# Patient Record
Sex: Male | Born: 1990 | Hispanic: No | Marital: Single | Smoking: Current every day smoker
Health system: Southern US, Community
[De-identification: ages and names within clinical notes are randomized; demographics above are authoritative.]

---

## 2013-03-11 ENCOUNTER — Encounter (HOSPITAL_COMMUNITY): Payer: Self-pay | Admitting: Emergency Medicine

## 2013-03-11 ENCOUNTER — Other Ambulatory Visit: Payer: Self-pay

## 2013-03-11 ENCOUNTER — Emergency Department (HOSPITAL_COMMUNITY): Payer: PRIVATE HEALTH INSURANCE

## 2013-03-11 ENCOUNTER — Emergency Department (HOSPITAL_COMMUNITY)
Admission: EM | Admit: 2013-03-11 | Discharge: 2013-03-11 | Disposition: A | Discharge status: Home / Self Care | Payer: PRIVATE HEALTH INSURANCE | Attending: Emergency Medicine | Admitting: Emergency Medicine

## 2013-03-11 DIAGNOSIS — R071 Chest pain on breathing: Secondary | ICD-10-CM | POA: Insufficient documentation

## 2013-03-11 DIAGNOSIS — Z791 Long term (current) use of non-steroidal anti-inflammatories (NSAID): Secondary | ICD-10-CM | POA: Insufficient documentation

## 2013-03-11 DIAGNOSIS — R319 Hematuria, unspecified: Secondary | ICD-10-CM | POA: Insufficient documentation

## 2013-03-11 DIAGNOSIS — R0789 Other chest pain: Secondary | ICD-10-CM

## 2013-03-11 DIAGNOSIS — F17200 Nicotine dependence, unspecified, uncomplicated: Secondary | ICD-10-CM | POA: Insufficient documentation

## 2013-03-11 LAB — CBC WITH DIFFERENTIAL/PLATELET
BASOS ABS: 0.1 10*3/uL (ref 0.0–0.1)
Basophils Relative: 1 % (ref 0–1)
EOS PCT: 3 % (ref 0–5)
Eosinophils Absolute: 0.2 10*3/uL (ref 0.0–0.7)
HCT: 43.3 % (ref 39.0–52.0)
Hemoglobin: 15.7 g/dL (ref 13.0–17.0)
LYMPHS ABS: 2.9 10*3/uL (ref 0.7–4.0)
LYMPHS PCT: 38 % (ref 12–46)
MCH: 30.4 pg (ref 26.0–34.0)
MCHC: 36.3 g/dL — ABNORMAL HIGH (ref 30.0–36.0)
MCV: 83.9 fL (ref 78.0–100.0)
Monocytes Absolute: 0.7 10*3/uL (ref 0.1–1.0)
Monocytes Relative: 9 % (ref 3–12)
NEUTROS ABS: 3.9 10*3/uL (ref 1.7–7.7)
NEUTROS PCT: 50 % (ref 43–77)
Platelets: 242 10*3/uL (ref 150–400)
RBC: 5.16 MIL/uL (ref 4.22–5.81)
RDW: 12.1 % (ref 11.5–15.5)
WBC: 7.8 10*3/uL (ref 4.0–10.5)

## 2013-03-11 LAB — COMPREHENSIVE METABOLIC PANEL
ALK PHOS: 65 U/L (ref 39–117)
ALT: 29 U/L (ref 0–53)
AST: 19 U/L (ref 0–37)
Albumin: 4 g/dL (ref 3.5–5.2)
BUN: 19 mg/dL (ref 6–23)
CO2: 27 meq/L (ref 19–32)
Calcium: 9.7 mg/dL (ref 8.4–10.5)
Chloride: 100 mEq/L (ref 96–112)
Creatinine, Ser: 1.01 mg/dL (ref 0.50–1.35)
GFR calc Af Amer: >90 mL/min (ref >90)
GLUCOSE: 69 mg/dL — AB (ref 70–99)
POTASSIUM: 3.8 meq/L (ref 3.7–5.3)
SODIUM: 140 meq/L (ref 137–147)
Total Bilirubin: 0.6 mg/dL (ref 0.3–1.2)
Total Protein: 7.4 g/dL (ref 6.0–8.3)

## 2013-03-11 LAB — TROPONIN I
Troponin I: <0.3 ng/mL (ref <0.30)
Troponin I: <0.3 ng/mL (ref <0.30)

## 2013-03-11 LAB — RAPID URINE DRUG SCREEN, HOSP PERFORMED
Amphetamines: NOT DETECTED
BARBITURATES: NOT DETECTED
Benzodiazepines: NOT DETECTED
COCAINE: NOT DETECTED
Opiates: NOT DETECTED
TETRAHYDROCANNABINOL: NOT DETECTED

## 2013-03-11 LAB — D-DIMER, QUANTITATIVE: D-Dimer, Quant: 0.39 ug/mL-FEU (ref 0.00–0.48)

## 2013-03-11 MED ORDER — IBUPROFEN 800 MG PO TABS
800.0000 mg | ORAL_TABLET | Freq: Once | ORAL | Status: AC
  Administered 2013-03-11: 800 mg via ORAL
  Filled 2013-03-11: qty 1

## 2013-03-11 MED ORDER — IBUPROFEN 800 MG PO TABS: 800.0000 mg | ORAL_TABLET | Freq: Three times a day (TID) | ORAL | Status: AC

## 2013-03-11 NOTE — ED Notes (Signed)
Pt from home c/o L sided CP x3 days. Pt denies SOB, N/V/D, diaphoresis. Pt states that he goes to gym, but has not done anything differently that may have caused injury. Pt is A&O and in NAD. Pt family at bedside. Pt EKG completed

## 2013-03-11 NOTE — Discharge Instructions (Signed)
Chest Wall Pain °There is no evidence of heart attack or blood clot in the lung. Follow up with your doctor. Return to the ED if you develop new or worsening symptoms. °Chest wall pain is pain in or around the bones and muscles of your chest. It may take up to 6 weeks to get better. It may take longer if you must stay physically active in your work and activities.  °CAUSES  °Chest wall pain may happen on its own. However, it may be caused by: °· A viral illness like the flu. °· Injury. °· Coughing. °· Exercise. °· Arthritis. °· Fibromyalgia. °· Shingles. °HOME CARE INSTRUCTIONS  °· Avoid overtiring physical activity. Try not to strain or perform activities that cause pain. This includes any activities using your chest or your abdominal and side muscles, especially if heavy weights are used. °· Put ice on the sore area. °· Put ice in a plastic bag. °· Place a towel between your skin and the bag. °· Leave the ice on for 15-20 minutes per hour while awake for the first 2 days. °· Only take over-the-counter or prescription medicines for pain, discomfort, or fever as directed by your caregiver. °SEEK IMMEDIATE MEDICAL CARE IF:  °· Your pain increases, or you are very uncomfortable. °· You have a fever. °· Your chest pain becomes worse. °· You have new, unexplained symptoms. °· You have nausea or vomiting. °· You feel sweaty or lightheaded. °· You have a cough with phlegm (sputum), or you cough up blood. °MAKE SURE YOU:  °· Understand these instructions. °· Will watch your condition. °· Will get help right away if you are not doing well or get worse. °Document Released: 12/30/2004 Document Revised: 03/24/2011 Document Reviewed: 08/26/2010 °[employer]® Patient Information ©2014 [employer]. ° °

## 2013-03-11 NOTE — ED Provider Notes (Signed)
CSN: [id number]     Arrival date & time 03/11/13  4098 History   First MD Initiated Contact with Patient 03/11/13 [phone]     Chief Complaint  Patient presents with  . Chest Pain     (Consider location/radiation/quality/duration/timing/severity/associated sxs/prior Treatment) HPI Comments: Patient presents with three-day history of left-sided chest pain that is constant. He denies any type of injury but he does work out in [employer] Nothing makes the pain better or worse. His been constant nonradiating for 3 days. There is no shortness of breath, nausea, vomiting or diaphoresis. There is no neck, jaw or back pain. Patient is from [place] and had a plane trip one month ago. He denies any focal weakness, numbness or tingling. Denies any cough or fever. He denies any leg pain or leg swelling. The pain is worse with palpation and movement of his chest.  The history is provided by the patient and a friend.    History reviewed. No pertinent past medical history. History reviewed. No pertinent past surgical history. No family history on file. History  Substance Use Topics  . Smoking status: Current Every Day Smoker    Types: Pipe  . Smokeless tobacco: Not on file  . Alcohol Use: No    Review of Systems  Constitutional: Negative for fever, activity change and appetite change.  HENT: Negative for congestion and rhinorrhea.   Respiratory: Negative for cough, chest tightness and shortness of breath.   Cardiovascular: Positive for chest pain.  Gastrointestinal: Negative for nausea, vomiting and abdominal pain.  Genitourinary: Positive for hematuria. Negative for dysuria.  Musculoskeletal: Negative for arthralgias and back pain.  Skin: Negative for rash.  Neurological: Negative for dizziness, weakness and headaches.  A complete 10 system review of systems was obtained and all systems are negative except as noted in the HPI and PMH.      Allergies  Review of patient's allergies indicates no  known allergies.  Home Medications   Current Outpatient Rx  Name  Route  Sig  Dispense  Refill  . ibuprofen (ADVIL,MOTRIN) 800 MG tablet   Oral   Take 1 tablet (800 mg total) by mouth 3 (three) times daily.   21 tablet   0    BP 134/70  Pulse 84  Temp(Src) 97.8 F (36.6 C) (Oral)  Resp 19  SpO2 99% Physical Exam  Constitutional: He is oriented to person, place, and time. He appears well-developed and well-nourished.  HENT:  Head: Normocephalic and atraumatic.  Mouth/Throat: Oropharynx is clear and moist. No oropharyngeal exudate.  Eyes: Conjunctivae and EOM are normal. Pupils are equal, round, and reactive to light.  Neck: Normal range of motion. Neck supple.  Cardiovascular: Normal rate, regular rhythm and normal heart sounds.   No murmur heard. Pulmonary/Chest: Effort normal and breath sounds normal. No respiratory distress. He exhibits tenderness.  TTP L chest wall with point tenderness, no crepitance  Abdominal: Soft. There is no tenderness. There is no rebound and no guarding.  Musculoskeletal: Normal range of motion. He exhibits no edema and no tenderness.  Neurological: He is alert and oriented to person, place, and time. No cranial nerve deficit. He exhibits normal muscle tone. Coordination normal.  Skin: Skin is warm.    ED Course  Procedures (including critical care time) Labs Review Labs Reviewed  CBC WITH DIFFERENTIAL - Abnormal; Notable for the following:    MCHC 36.3 (*)    All other components within normal limits  COMPREHENSIVE METABOLIC PANEL - Abnormal; Notable for  the following:    Glucose, Bld 69 (*)    All other components within normal limits  TROPONIN I  D-DIMER, QUANTITATIVE  URINE RAPID DRUG SCREEN (HOSP PERFORMED)  TROPONIN I   Imaging Review Dg Chest 2 View  03/11/2013   CLINICAL DATA:  Chest pain.  EXAM: CHEST  2 VIEW  COMPARISON:  None.  FINDINGS: The heart size and pulmonary vascularity are normal and the lungs are clear. There is a  slight lumbar scoliosis. No other osseous abnormality.  IMPRESSION: Normal chest.   Electronically Signed   By: Geanie CooleyJim  Maxwell M.D.   On: 03/11/2013 08:29    EKG Interpretation  Date/Time:  Friday March 11 2013 08:09:17 EST Ventricular Rate:  82 PR Interval:  172 QRS Duration: 102 QT Interval:  365 QTC Calculation: 426 R Axis:   74 Text Interpretation:  Sinus rhythm Borderline Q waves in lateral leads Nonspecific T abnormalities, lateral leads diffuse ST elevation No previous ECGs available Confirmed by Manus GunningANCOUR  MD, Jamieson Lisa ([id number]) on 03/11/2013 8:30:33 AM        MDM   Final diagnoses:  Chest wall pain   Three-day history of chest pain that is constant and worse with palpation. No shortness of breath, fever or cough. EKG is poor quality with but shows diffuse ST elevation. We'll repeat.  Chest x-ray negative. D-dimer negative Troponin negative.  Chest pain is reproducible. EKG and chest x-ray are reassuring. No evidence of a serious. D-dimer negative. Doubt PE. We'll treat with anti-inflammatories for musculoskeletal chest pain. Return precautions discussed.     Glynn OctaveStephen Amorette Charrette, MD 03/11/13 1028

## 2015-03-24 ENCOUNTER — Encounter (HOSPITAL_COMMUNITY): Payer: Self-pay | Admitting: Oncology

## 2015-03-24 ENCOUNTER — Emergency Department (HOSPITAL_COMMUNITY)
Admission: EM | Admit: 2015-03-24 | Discharge: 2015-03-24 | Disposition: A | Discharge status: Home / Self Care | Payer: PPO | Attending: Emergency Medicine | Admitting: Emergency Medicine

## 2015-03-24 ENCOUNTER — Emergency Department (HOSPITAL_COMMUNITY): Payer: PPO

## 2015-03-24 DIAGNOSIS — R51 Headache: Secondary | ICD-10-CM | POA: Insufficient documentation

## 2015-03-24 DIAGNOSIS — F17200 Nicotine dependence, unspecified, uncomplicated: Secondary | ICD-10-CM | POA: Diagnosis not present

## 2015-03-24 DIAGNOSIS — R519 Headache, unspecified: Secondary | ICD-10-CM

## 2015-03-24 NOTE — ED Provider Notes (Signed)
CSN: [id number]     Arrival date & time 03/24/15  0304 History   First MD Initiated Contact with Patient 03/24/15 [phone]     Chief Complaint  Patient presents with  . Migraine     (Consider location/radiation/quality/duration/timing/severity/associated sxs/prior Treatment) HPI Donald Wilcox is a 25 y.o. male Comes in for evaluation of headache. Patient reports whenever he wakes up in the morning and goes to school, the sunlight will hurt his eyes and he will experience a right sided headache that will last for approximately one hour until he takes Panolol. States at night he does not experience these headaches. Symptoms have been ongoing for the past one month. He reports he has not been to an optometrist in "many years". He does not have a primary care doctor.He denies any headache or other discomfort now on the emergency department. Denies any fevers, chills, neck pain, numbness or weakness, nausea or vomiting, other chest pain or shortness of breath, abdominal pain, rash. Of note, patient reports he is from [place], [place].  History reviewed. No pertinent past medical history. History reviewed. No pertinent past surgical history. No family history on file. Social History  Substance Use Topics  . Smoking status: Current Every Day Smoker    Types: Pipe  . Smokeless tobacco: Never Used  . Alcohol Use: No    Review of Systems A 10 point review of systems was completed and was negative except for pertinent positives and negatives as mentioned in the history of present illness     Allergies  Review of patient's allergies indicates no known allergies.  Home Medications   Prior to Admission medications   Medication Sig Start Date End Date Taking? Authorizing Provider  OVER THE COUNTER MEDICATION Take 2 tablets by mouth every 8 (eight) hours as needed (for headache). OVER THE COUNTER HEADACHE MEDICATION FROM HIS COUNTRY   Yes Historical Provider, MD  ibuprofen (ADVIL,MOTRIN) 800 MG  tablet Take 1 tablet (800 mg total) by mouth 3 (three) times daily. Patient not taking: Reported on 03/24/2015 03/11/13   Glynn Octave, MD   BP 115/82 mmHg  Pulse 67  Temp(Src) 98.5 F (36.9 C) (Oral)  Resp 20  Wt 60 kg  SpO2 100% Physical Exam  Constitutional: He is oriented to person, place, and time. He appears well-developed and well-nourished. No distress.  HENT:  Head: Normocephalic and atraumatic.  Mouth/Throat: Oropharynx is clear and moist.  Eyes: Conjunctivae and EOM are normal. Pupils are equal, round, and reactive to light. Right eye exhibits no discharge. Left eye exhibits no discharge. No scleral icterus.  Neck: Normal range of motion. Neck supple.  No meningismus or nuchal rigidity  Cardiovascular: Normal rate, regular rhythm and normal heart sounds.   Pulmonary/Chest: Effort normal and breath sounds normal. No respiratory distress. He has no wheezes. He has no rales.  Abdominal: Soft. There is no tenderness.  Musculoskeletal: Normal range of motion. He exhibits no edema or tenderness.  Neurological: He is alert and oriented to person, place, and time.  Cranial Nerves II-XII grossly intact. Motor strength is 5/5 in all 4 extremities. Sensation is intact to light touch. Completes finger to nose coordination movements without difficulty. No pronator drift. Gait is baseline without ataxia.  Skin: Skin is warm and dry. No rash noted. He is not diaphoretic.  Psychiatric: He has a normal mood and affect.  Nursing note and vitals reviewed.   ED Course  Procedures (including critical care time) Labs Review Labs Reviewed - No data to  display  Imaging Review Ct Head Wo Contrast  03/24/2015  CLINICAL DATA:  Right-sided headache and photophobia. EXAM: CT HEAD WITHOUT CONTRAST TECHNIQUE: Contiguous axial images were obtained from the base of the skull through the vertex without intravenous contrast. COMPARISON:  None. FINDINGS: Brain: No evidence of acute infarction,  hemorrhage, extra-axial collection, ventriculomegaly, or mass effect. Vascular: No hyperdense vessel or unexpected calcification. Skull: Negative for fracture or focal lesion. Sinuses/Orbits: No acute findings. Other: None. IMPRESSION: Normal Electronically Signed   By: Gerome Samavid  Williams III M.D   On: 03/24/2015 06:40   I have personally reviewed and evaluated these images and lab results as part of my medical decision-making.   EKG Interpretation None     Meds given in ED:  Medications - No data to display  New Prescriptions   No medications on file   Filed Vitals:   03/24/15 0322 03/24/15 0643  BP: 119/76 115/82  Pulse: 63 67  Temp: 98.3 F (36.8 C) 98.5 F (36.9 C)  TempSrc: Oral Oral  Resp: 16 20  Weight: 60 kg   SpO2: 100% 100%    MDM  Donald Wilcox is a [age] y.o. male [place], comes in for evaluation of headache, ongoing over the past 4 weeks. Headache presents with sunlight and has associated photophobia. Never occurs at night or in dark. Resolves with Tylenol. On arrival, patient is hemodynamically stable and afebrile. Physical exam is unremarkable with nonfocal neuro exam. Gait is baseline. Denies any discomfort whatsoever now on the emergency department. CT scan of his head is without any acute abnormality. Due to chronicity of patient's problem, low suspicion for acute encephalitis or other emergent intracranial abnormality. Given referral to primary care for follow-up. Also discussed strict return precautions. Patient verbalizes understanding and agrees with this plan as well as subsequent discharge. Prior to patient discharge, I discussed and reviewed this case with Dr.Palumbo  Final diagnoses:  Nonintractable headache, unspecified chronicity pattern, unspecified headache type        [name], PA-C 03/24/15 [id number]  April Palumbo, MD 03/24/15 2315

## 2015-03-24 NOTE — ED Notes (Signed)
Awake. Verbally responsive. A/O x4. Resp even and unlabored. No audible adventitious breath sounds noted. ABC's intact.  

## 2015-03-24 NOTE — Discharge Instructions (Signed)
There does not appear to be an emergent cause for your headache at this time. Your exam was reassuring Andyour CT scan was normal. You may continue taking Motrin or Tylenol for your discomfort. Follow-up with your doctor or the [facility] to establish primary care. Return to ED for new or worsening symptoms as we discussed.  General Headache Without Cause A headache is pain or discomfort felt around the head or neck area. There are many causes and types of headaches. In some cases, the cause may not be found.  HOME CARE  Managing Pain  Take over-the-counter and prescription medicines only as told by your doctor.  Lie down in a dark, quiet room when you have a headache.  If directed, apply ice to the head and neck area:  Put ice in a plastic bag.  Place a towel between your skin and the bag.  Leave the ice on for 20 minutes, 2-3 times per day.  Use a heating pad or hot shower to apply heat to the head and neck area as told by your doctor.  Keep lights dim if bright lights bother you or make your headaches worse. Eating and Drinking  Eat meals on a regular schedule.  Lessen how much alcohol you drink.  Lessen how much caffeine you drink, or stop drinking caffeine. General Instructions  Keep all follow-up visits as told by your doctor. This is important.  Keep a journal to find out if certain things bring on headaches. For example, write down:  What you eat and drink.  How much sleep you get.  Any change to your diet or medicines.  Relax by getting a massage or doing other relaxing activities.  Lessen stress.  Sit up straight. Do not tighten (tense) your muscles.  Do not use tobacco products. This includes cigarettes, chewing tobacco, or e-cigarettes. If you need help quitting, ask your doctor.  Exercise regularly as told by your doctor.  Get enough sleep. This often means 7-9 hours of sleep. GET HELP IF:  Your symptoms are not helped by  medicine.  You have a headache that feels different than the other headaches.  You feel sick to your stomach (nauseous) or you throw up (vomit).  You have a fever. GET HELP RIGHT AWAY IF:   Your headache becomes really bad.  You keep throwing up.  You have a stiff neck.  You have trouble seeing.  You have trouble speaking.  You have pain in the eye or ear.  Your muscles are weak or you lose muscle control.  You lose your balance or have trouble walking.  You feel like you will pass out (faint) or you pass out.  You have confusion.   This information is not intended to replace advice given to you by your health care provider. Make sure you discuss any questions you have with your health care provider.   Document Released: 10/09/2007 Document Revised: 09/20/2014 Document Reviewed: 04/24/2014 Elsevier Interactive Patient Education [employer]

## 2015-03-24 NOTE — ED Notes (Signed)
Per pt he has a right sided headache.  + photophobia.  Per pt when the sun hits his eyes he develops a HA.  This has been going on consistently for 3-4 weeks.  Per pt when he is in his home country he does not usually come out until after dark however upon returning to the [place] he began to have these HA again.  Pt is A&o x 4.  Rates 7/10, stabbing in nature.

## 2016-07-27 IMAGING — CT CT HEAD W/O CM
2 series · 16 of 30 positions shown, 20 images · non-contrast
Comparison: None.

CLINICAL DATA: Right-sided headache and photophobia.

EXAM:
CT HEAD WITHOUT CONTRAST
TECHNIQUE: Contiguous axial images were obtained from the base of the skull
through the vertex without intravenous contrast.

[Series 2: head w/o · axial · non-contrast · 0.45mm/px · z∈[-159,-39]mm · 13 of 28 slices shown, 17 images]
[im 2/28  brain]
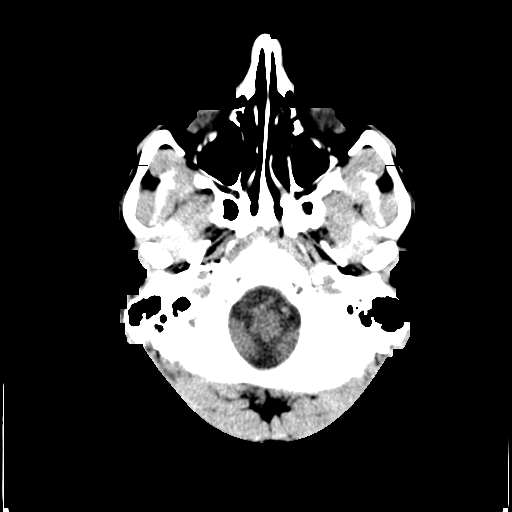
[im 2/28  bone]
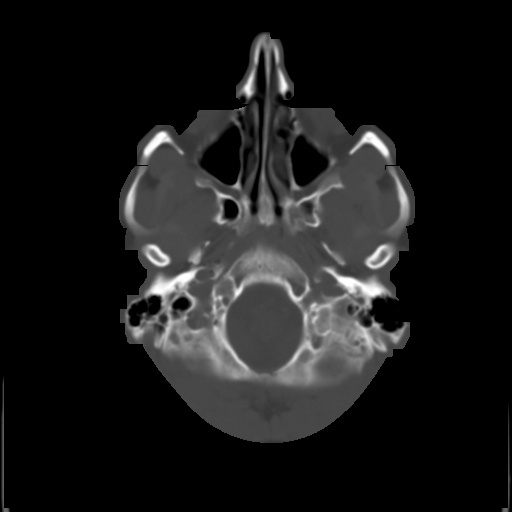
[im 4/28  brain]
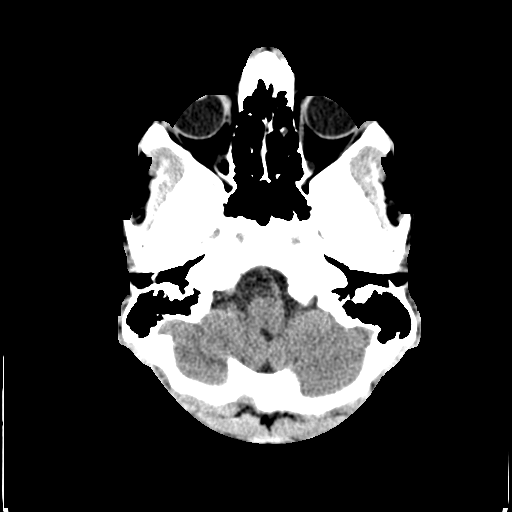
[im 6/28  brain]
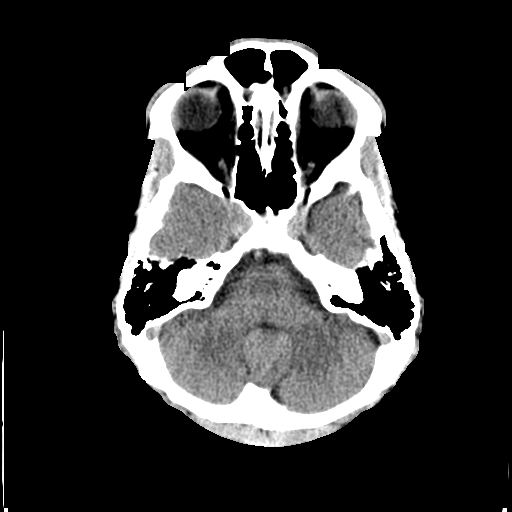
[im 8/28  brain]
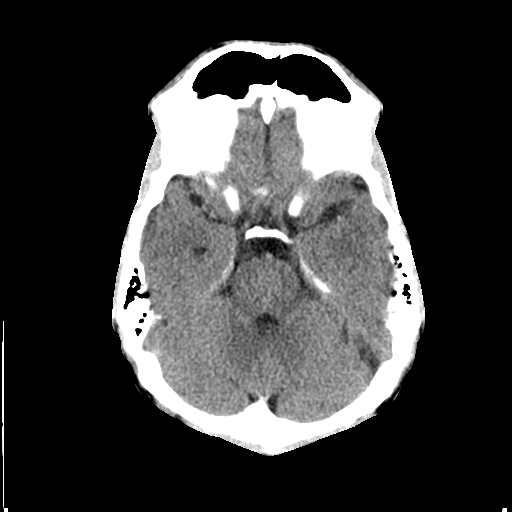
[im 10/28  brain]
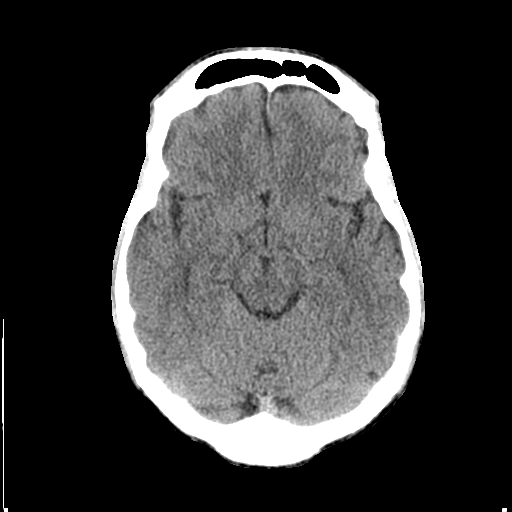
[im 10/28  bone]
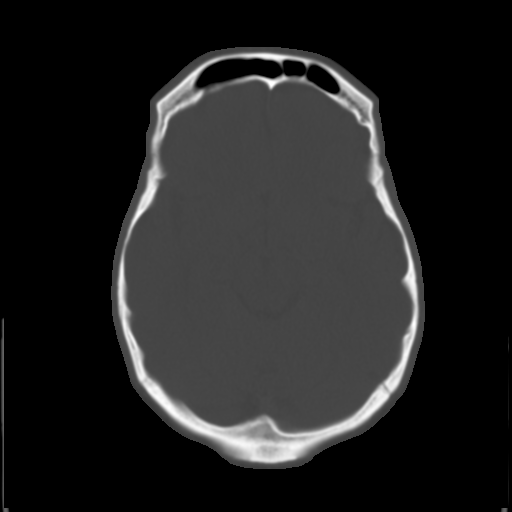
[im 12/28  brain]
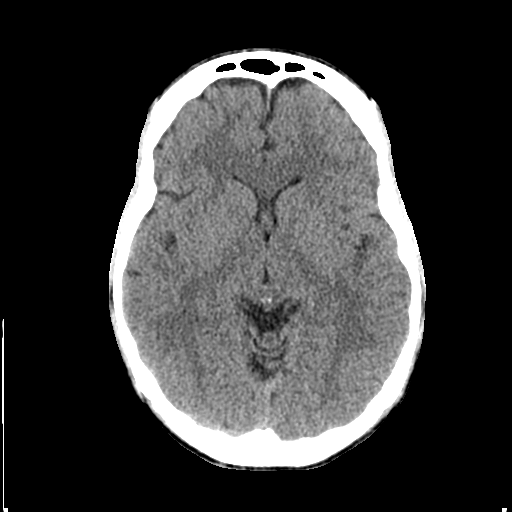
[im 14/28  brain]
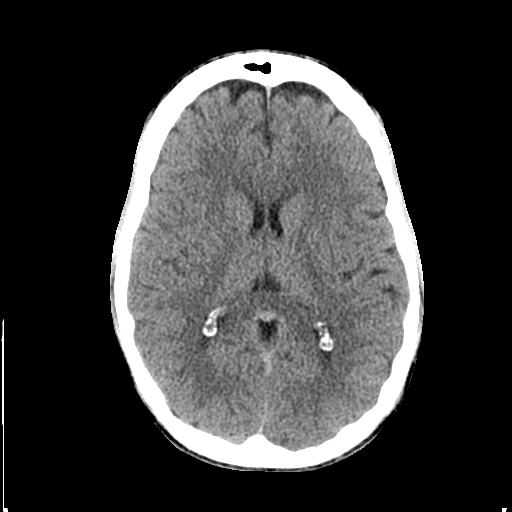
[im 16/28  brain]
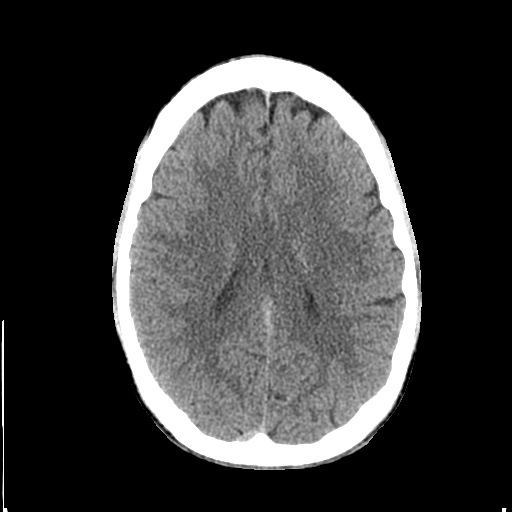
[im 18/28  brain]
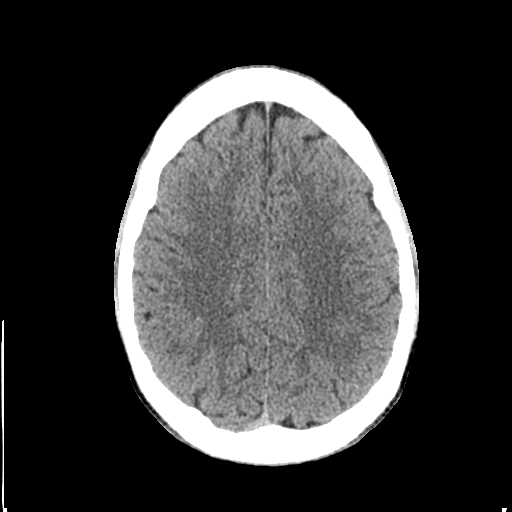
[im 18/28  bone]
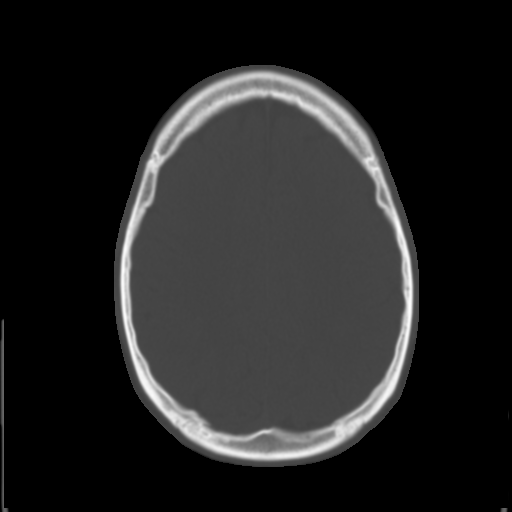
[im 20/28  brain]
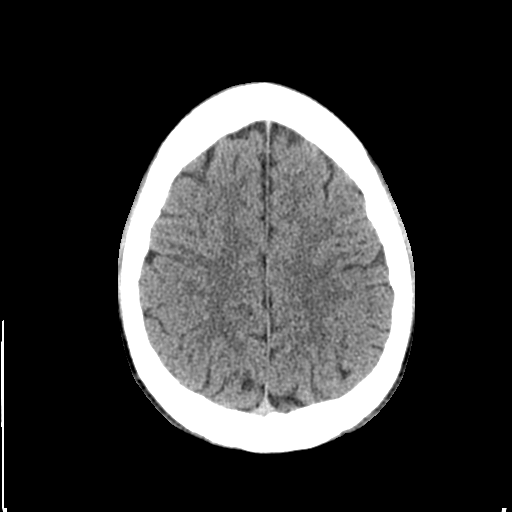
[im 22/28  brain]
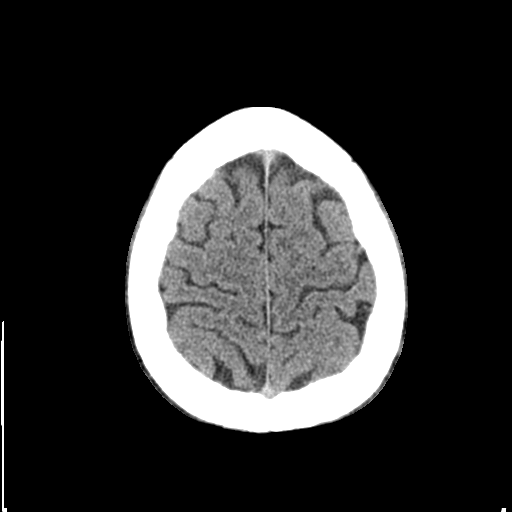
[im 24/28  brain]
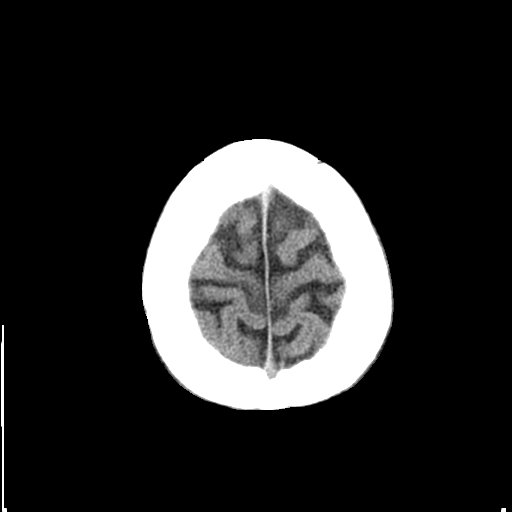
[im 26/28  brain]
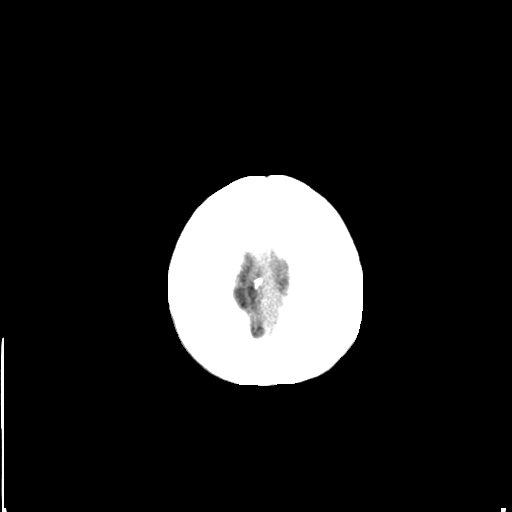
[im 26/28  bone]
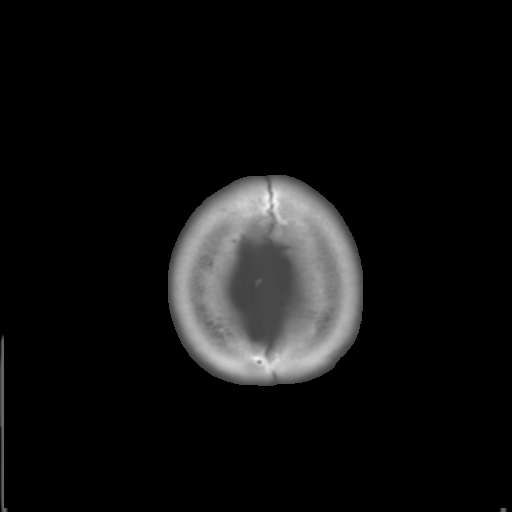

[Series 3: bone windows · axial · 0.45mm/px · z∈[-159,-119]mm · 3 of 28 slices shown]
[im 2/28  bone]
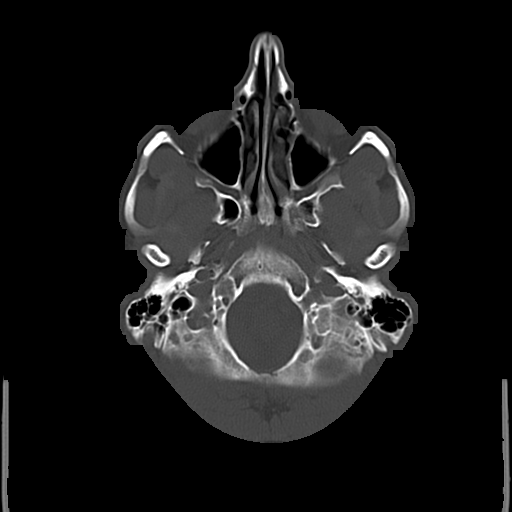
[im 6/28  bone]
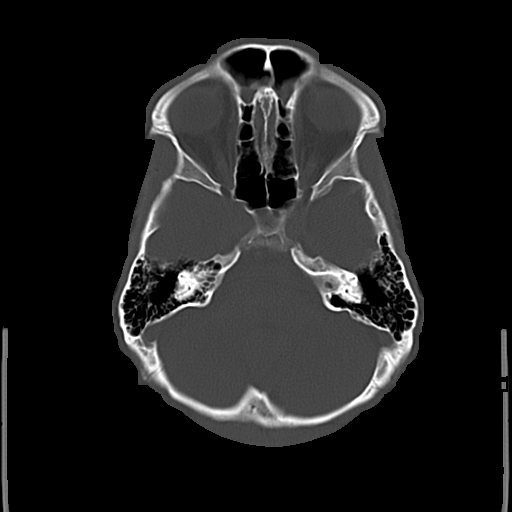
[im 10/28  bone]
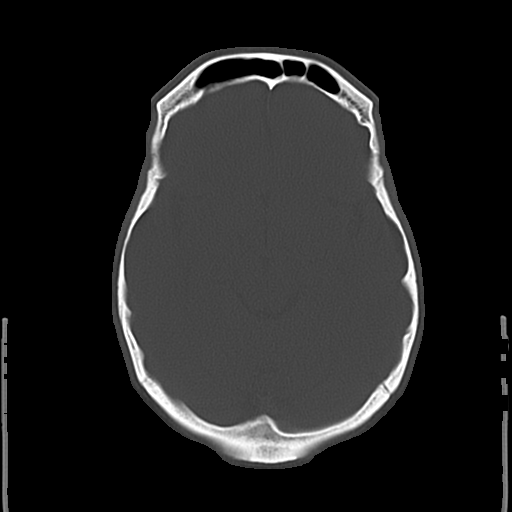

[16 of 30 positions shown; findings below may reference images not displayed]

FINDINGS: Brain: No evidence of acute infarction, hemorrhage, extra-axial
collection, ventriculomegaly, or mass effect.

Vascular: No hyperdense vessel or unexpected calcification.

Skull: Negative for fracture or focal lesion.

Sinuses/Orbits: No acute findings.

Other: None.
IMPRESSION: Normal
